# Patient Record
Sex: Female | Born: 1940 | Race: White | Hispanic: No | Marital: Single | Smoking: Never smoker
Health system: Southern US, Community
[De-identification: ages and names within clinical notes are randomized; demographics above are authoritative.]

## PROBLEM LIST (undated history)

## (undated) DIAGNOSIS — F410 Panic disorder [episodic paroxysmal anxiety] without agoraphobia: Secondary | ICD-10-CM

## (undated) DIAGNOSIS — I499 Cardiac arrhythmia, unspecified: Secondary | ICD-10-CM

## (undated) DIAGNOSIS — F419 Anxiety disorder, unspecified: Secondary | ICD-10-CM

## (undated) HISTORY — PX: BREAST BIOPSY: SHX20

## (undated) HISTORY — PX: BREAST EXCISIONAL BIOPSY: SUR124

---

## 1997-11-15 ENCOUNTER — Ambulatory Visit (HOSPITAL_COMMUNITY): Admission: RE | Admit: 1997-11-15 | Discharge: 1997-11-15 | Payer: Self-pay | Admitting: *Deleted

## 1998-11-21 ENCOUNTER — Ambulatory Visit (HOSPITAL_COMMUNITY): Admission: RE | Admit: 1998-11-21 | Discharge: 1998-11-21 | Payer: Self-pay | Admitting: *Deleted

## 1999-11-06 ENCOUNTER — Ambulatory Visit (HOSPITAL_COMMUNITY): Admission: RE | Admit: 1999-11-06 | Discharge: 1999-11-06 | Payer: Self-pay

## 1999-11-12 ENCOUNTER — Encounter: Admission: RE | Admit: 1999-11-12 | Discharge: 1999-11-12 | Payer: Self-pay

## 2000-11-08 ENCOUNTER — Ambulatory Visit (HOSPITAL_COMMUNITY): Admission: RE | Admit: 2000-11-08 | Discharge: 2000-11-08 | Payer: Self-pay | Admitting: Gastroenterology

## 2000-11-18 ENCOUNTER — Ambulatory Visit (HOSPITAL_COMMUNITY): Admission: RE | Admit: 2000-11-18 | Discharge: 2000-11-18 | Payer: Self-pay | Admitting: Obstetrics & Gynecology

## 2000-11-22 ENCOUNTER — Encounter: Payer: Self-pay | Admitting: Obstetrics & Gynecology

## 2000-11-22 ENCOUNTER — Encounter: Admission: RE | Admit: 2000-11-22 | Discharge: 2000-11-22 | Payer: Self-pay | Admitting: Obstetrics & Gynecology

## 2001-11-24 ENCOUNTER — Ambulatory Visit (HOSPITAL_COMMUNITY): Admission: RE | Admit: 2001-11-24 | Discharge: 2001-11-24 | Payer: Self-pay | Admitting: Obstetrics & Gynecology

## 2001-11-27 ENCOUNTER — Encounter: Payer: Self-pay | Admitting: Internal Medicine

## 2001-11-27 ENCOUNTER — Encounter: Admission: RE | Admit: 2001-11-27 | Discharge: 2001-11-27 | Payer: Self-pay | Admitting: Internal Medicine

## 2002-11-23 ENCOUNTER — Ambulatory Visit (HOSPITAL_COMMUNITY): Admission: RE | Admit: 2002-11-23 | Discharge: 2002-11-23 | Payer: Self-pay | Admitting: *Deleted

## 2002-12-14 ENCOUNTER — Encounter: Payer: Self-pay | Admitting: Internal Medicine

## 2002-12-14 ENCOUNTER — Encounter: Admission: RE | Admit: 2002-12-14 | Discharge: 2002-12-14 | Payer: Self-pay | Admitting: Internal Medicine

## 2003-12-16 ENCOUNTER — Encounter: Admission: RE | Admit: 2003-12-16 | Discharge: 2003-12-16 | Payer: Self-pay | Admitting: Internal Medicine

## 2005-02-08 ENCOUNTER — Encounter: Admission: RE | Admit: 2005-02-08 | Discharge: 2005-02-08 | Payer: Self-pay | Admitting: Internal Medicine

## 2005-03-03 ENCOUNTER — Encounter: Admission: RE | Admit: 2005-03-03 | Discharge: 2005-03-03 | Payer: Self-pay | Admitting: Internal Medicine

## 2005-03-05 ENCOUNTER — Encounter: Admission: RE | Admit: 2005-03-05 | Discharge: 2005-03-05 | Payer: Self-pay | Admitting: Internal Medicine

## 2005-03-05 ENCOUNTER — Encounter (INDEPENDENT_AMBULATORY_CARE_PROVIDER_SITE_OTHER): Payer: Self-pay | Admitting: Specialist

## 2005-10-07 ENCOUNTER — Encounter: Admission: RE | Admit: 2005-10-07 | Discharge: 2005-10-07 | Payer: Self-pay | Admitting: Internal Medicine

## 2006-02-09 ENCOUNTER — Encounter: Admission: RE | Admit: 2006-02-09 | Discharge: 2006-02-09 | Payer: Self-pay | Admitting: Surgery

## 2007-02-13 ENCOUNTER — Encounter: Admission: RE | Admit: 2007-02-13 | Discharge: 2007-02-13 | Payer: Self-pay

## 2008-02-14 ENCOUNTER — Encounter: Admission: RE | Admit: 2008-02-14 | Discharge: 2008-02-14 | Payer: Self-pay | Admitting: Surgery

## 2009-02-14 ENCOUNTER — Encounter: Admission: RE | Admit: 2009-02-14 | Discharge: 2009-02-14 | Payer: Self-pay | Admitting: Unknown Physician Specialty

## 2010-02-16 ENCOUNTER — Encounter
Admission: RE | Admit: 2010-02-16 | Discharge: 2010-02-16 | Payer: Self-pay | Source: Home / Self Care | Attending: Unknown Physician Specialty | Admitting: Unknown Physician Specialty

## 2010-03-28 ENCOUNTER — Encounter: Payer: Self-pay | Admitting: Internal Medicine

## 2011-01-13 ENCOUNTER — Other Ambulatory Visit: Payer: Self-pay | Admitting: Internal Medicine

## 2011-01-13 DIAGNOSIS — Z1231 Encounter for screening mammogram for malignant neoplasm of breast: Secondary | ICD-10-CM

## 2011-02-19 ENCOUNTER — Ambulatory Visit
Admission: RE | Admit: 2011-02-19 | Discharge: 2011-02-19 | Disposition: A | Discharge status: Home / Self Care | Payer: Medicare Other | Source: Ambulatory Visit | Attending: Internal Medicine | Admitting: Internal Medicine

## 2011-02-19 DIAGNOSIS — Z1231 Encounter for screening mammogram for malignant neoplasm of breast: Secondary | ICD-10-CM

## 2012-03-13 ENCOUNTER — Other Ambulatory Visit (HOSPITAL_BASED_OUTPATIENT_CLINIC_OR_DEPARTMENT_OTHER): Payer: Self-pay | Admitting: Internal Medicine

## 2012-03-13 DIAGNOSIS — Z1231 Encounter for screening mammogram for malignant neoplasm of breast: Secondary | ICD-10-CM

## 2012-03-30 ENCOUNTER — Ambulatory Visit (HOSPITAL_BASED_OUTPATIENT_CLINIC_OR_DEPARTMENT_OTHER)
Admission: RE | Admit: 2012-03-30 | Discharge: 2012-03-30 | Disposition: A | Discharge status: Home / Self Care | Payer: Medicare Other | Source: Ambulatory Visit | Attending: Internal Medicine | Admitting: Internal Medicine

## 2012-03-30 DIAGNOSIS — Z1231 Encounter for screening mammogram for malignant neoplasm of breast: Secondary | ICD-10-CM | POA: Insufficient documentation

## 2013-03-06 ENCOUNTER — Other Ambulatory Visit (HOSPITAL_BASED_OUTPATIENT_CLINIC_OR_DEPARTMENT_OTHER): Payer: Self-pay | Admitting: Internal Medicine

## 2013-03-06 DIAGNOSIS — Z1231 Encounter for screening mammogram for malignant neoplasm of breast: Secondary | ICD-10-CM

## 2013-04-02 ENCOUNTER — Ambulatory Visit (HOSPITAL_BASED_OUTPATIENT_CLINIC_OR_DEPARTMENT_OTHER): Payer: Medicare Other

## 2013-04-06 ENCOUNTER — Ambulatory Visit (HOSPITAL_BASED_OUTPATIENT_CLINIC_OR_DEPARTMENT_OTHER): Payer: Medicare Other

## 2013-04-09 ENCOUNTER — Ambulatory Visit (HOSPITAL_BASED_OUTPATIENT_CLINIC_OR_DEPARTMENT_OTHER): Payer: Medicare Other

## 2013-04-16 ENCOUNTER — Ambulatory Visit (HOSPITAL_BASED_OUTPATIENT_CLINIC_OR_DEPARTMENT_OTHER)
Admission: RE | Admit: 2013-04-16 | Discharge: 2013-04-16 | Disposition: A | Discharge status: Home / Self Care | Payer: Medicare Other | Source: Ambulatory Visit | Attending: Internal Medicine | Admitting: Internal Medicine

## 2013-04-16 DIAGNOSIS — Z1231 Encounter for screening mammogram for malignant neoplasm of breast: Secondary | ICD-10-CM | POA: Insufficient documentation

## 2014-03-18 ENCOUNTER — Other Ambulatory Visit (HOSPITAL_BASED_OUTPATIENT_CLINIC_OR_DEPARTMENT_OTHER): Payer: Self-pay | Admitting: Internal Medicine

## 2014-03-18 DIAGNOSIS — Z1231 Encounter for screening mammogram for malignant neoplasm of breast: Secondary | ICD-10-CM

## 2014-04-22 ENCOUNTER — Ambulatory Visit (HOSPITAL_BASED_OUTPATIENT_CLINIC_OR_DEPARTMENT_OTHER): Payer: Self-pay

## 2014-04-23 ENCOUNTER — Ambulatory Visit (HOSPITAL_BASED_OUTPATIENT_CLINIC_OR_DEPARTMENT_OTHER)
Admission: RE | Admit: 2014-04-23 | Discharge: 2014-04-23 | Disposition: A | Discharge status: Home / Self Care | Payer: Medicare HMO | Source: Ambulatory Visit | Attending: Internal Medicine | Admitting: Internal Medicine

## 2014-04-23 DIAGNOSIS — Z1231 Encounter for screening mammogram for malignant neoplasm of breast: Secondary | ICD-10-CM | POA: Diagnosis not present

## 2015-03-31 ENCOUNTER — Other Ambulatory Visit (HOSPITAL_BASED_OUTPATIENT_CLINIC_OR_DEPARTMENT_OTHER): Payer: Self-pay | Admitting: Internal Medicine

## 2015-03-31 DIAGNOSIS — Z1231 Encounter for screening mammogram for malignant neoplasm of breast: Secondary | ICD-10-CM

## 2015-04-28 ENCOUNTER — Ambulatory Visit (HOSPITAL_BASED_OUTPATIENT_CLINIC_OR_DEPARTMENT_OTHER)
Admission: RE | Admit: 2015-04-28 | Discharge: 2015-04-28 | Disposition: A | Discharge status: Home / Self Care | Payer: Medicare HMO | Source: Ambulatory Visit | Attending: Internal Medicine | Admitting: Internal Medicine

## 2015-04-28 DIAGNOSIS — Z1231 Encounter for screening mammogram for malignant neoplasm of breast: Secondary | ICD-10-CM | POA: Insufficient documentation

## 2016-04-13 ENCOUNTER — Other Ambulatory Visit (HOSPITAL_BASED_OUTPATIENT_CLINIC_OR_DEPARTMENT_OTHER): Payer: Self-pay | Admitting: Internal Medicine

## 2016-04-13 DIAGNOSIS — Z1231 Encounter for screening mammogram for malignant neoplasm of breast: Secondary | ICD-10-CM

## 2016-04-29 ENCOUNTER — Encounter (HOSPITAL_BASED_OUTPATIENT_CLINIC_OR_DEPARTMENT_OTHER): Payer: Self-pay

## 2016-04-29 ENCOUNTER — Ambulatory Visit (HOSPITAL_BASED_OUTPATIENT_CLINIC_OR_DEPARTMENT_OTHER)
Admission: RE | Admit: 2016-04-29 | Discharge: 2016-04-29 | Disposition: A | Discharge status: Home / Self Care | Payer: Medicare HMO | Source: Ambulatory Visit | Attending: Internal Medicine | Admitting: Internal Medicine

## 2016-04-29 DIAGNOSIS — Z1231 Encounter for screening mammogram for malignant neoplasm of breast: Secondary | ICD-10-CM | POA: Diagnosis not present

## 2016-08-11 ENCOUNTER — Other Ambulatory Visit: Payer: Self-pay

## 2016-08-11 ENCOUNTER — Emergency Department (HOSPITAL_COMMUNITY): Payer: Medicare HMO

## 2016-08-11 ENCOUNTER — Emergency Department (HOSPITAL_COMMUNITY)
Admission: EM | Admit: 2016-08-11 | Discharge: 2016-08-11 | Disposition: A | Discharge status: Home / Self Care | Payer: Medicare HMO | Attending: Emergency Medicine | Admitting: Emergency Medicine

## 2016-08-11 ENCOUNTER — Encounter (HOSPITAL_COMMUNITY): Payer: Self-pay | Admitting: Neurology

## 2016-08-11 DIAGNOSIS — I499 Cardiac arrhythmia, unspecified: Secondary | ICD-10-CM | POA: Insufficient documentation

## 2016-08-11 DIAGNOSIS — R002 Palpitations: Secondary | ICD-10-CM | POA: Diagnosis present

## 2016-08-11 HISTORY — DX: Cardiac arrhythmia, unspecified: I49.9

## 2016-08-11 HISTORY — DX: Anxiety disorder, unspecified: F41.9

## 2016-08-11 HISTORY — DX: Panic disorder (episodic paroxysmal anxiety): F41.0

## 2016-08-11 LAB — CBC
HCT: 40.4 % (ref 36.0–46.0)
Hemoglobin: 12.5 g/dL (ref 12.0–15.0)
MCH: 24.3 pg — AB (ref 26.0–34.0)
MCHC: 30.9 g/dL (ref 30.0–36.0)
MCV: 78.6 fL (ref 78.0–100.0)
PLATELETS: 249 10*3/uL (ref 150–400)
RBC: 5.14 MIL/uL — AB (ref 3.87–5.11)
RDW: 15.3 % (ref 11.5–15.5)
WBC: 11.1 10*3/uL — AB (ref 4.0–10.5)

## 2016-08-11 LAB — BASIC METABOLIC PANEL
Anion gap: 9 (ref 5–15)
BUN: 16 mg/dL (ref 6–20)
CALCIUM: 9.7 mg/dL (ref 8.9–10.3)
CO2: 22 mmol/L (ref 22–32)
CREATININE: 0.92 mg/dL (ref 0.44–1.00)
Chloride: 106 mmol/L (ref 101–111)
GFR calc non Af Amer: 59 mL/min — ABNORMAL LOW (ref >60)
Glucose, Bld: 208 mg/dL — ABNORMAL HIGH (ref 65–99)
Potassium: 3.7 mmol/L (ref 3.5–5.1)
SODIUM: 137 mmol/L (ref 135–145)

## 2016-08-11 LAB — I-STAT TROPONIN, ED: TROPONIN I, POC: 0.01 ng/mL (ref 0.00–0.08)

## 2016-08-11 MED ORDER — SODIUM CHLORIDE 0.9 % IV BOLUS (SEPSIS)
500.0000 mL | Freq: Once | INTRAVENOUS | Status: AC
  Administered 2016-08-11: 500 mL via INTRAVENOUS

## 2016-08-11 MED ORDER — LORAZEPAM 2 MG/ML IJ SOLN
1.0000 mg | Freq: Once | INTRAMUSCULAR | Status: AC
  Administered 2016-08-11: 1 mg via INTRAVENOUS
  Filled 2016-08-11: qty 1

## 2016-08-11 MED ORDER — METOPROLOL TARTRATE 25 MG PO TABS: 25.0000 mg | ORAL_TABLET | Freq: Two times a day (BID) | ORAL | 0 refills | Status: AC | PRN

## 2016-08-11 MED ORDER — METOPROLOL TARTRATE 5 MG/5ML IV SOLN
2.5000 mg | Freq: Once | INTRAVENOUS | Status: AC
  Administered 2016-08-11: 2.5 mg via INTRAVENOUS
  Filled 2016-08-11: qty 5

## 2016-08-11 NOTE — ED Provider Notes (Addendum)
Medical screening examination/treatment/procedure(s) were conducted as a shared visit with non-physician practitioner(s) and myself.  I personally evaluated the patient during the encounter.   EKG Interpretation None     Patient has had problems with periodic racing heart and irregular heartbeats. She reports this is happening more often and sometimes at night keeps her awake. She reports she has been seen by [facility] cardiology. She is taking metoprolol but it doesn't seem to be helping. She reports her doctor told her not to take any more Ativan for the symptoms did seem to help. She reports she was taking once a day and last dose was 3 days ago. She does not have chest pain dyspnea or lower extremity swelling.  On exam patient is alert and appropriate. She does not have respiratory distress at rest. Heart is tachycardic and irregular. No peripheral edema. Skin condition very good. Neurologic is alert appropriate and coordinated.  EKG shows tachycardia with intermittent PACs and PVCs. Plan is for small fluid bolus, Ativan and a low dose of Lopressor. Pending patient's response and troponin results, will make final disposition. This time there does not appear to be acute ischemic symptomology.  I agree with plan and management.   Arby BarrettePfeiffer, Bellany Elbaum, MD 08/11/16 1501    Arby BarrettePfeiffer, Jaaliyah Lucatero, MD 08/16/16 [phone]

## 2016-08-11 NOTE — ED Notes (Signed)
Pt endorses understanding of d/c instructions and has no further questions. Pt stable and NAD. Pt's pastor/church member called for ride home. VSS.

## 2016-08-11 NOTE — ED Triage Notes (Addendum)
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 Today she was sitting and felt like her heart started racing. EMS arrived reports HR is erratic from 90-150's. Difficult to discern if afib or ST. Pt denies any pain. Is a x 4. Feels nervous. 150/94, 96% RA, 20RR. She reports history of irregular heart beat and is on her metoprolol, which she took this morning. Pt appears very nervous.

## 2016-08-11 NOTE — ED Provider Notes (Signed)
[facility]-EMERGENCY DEPT Provider Note   CSN: [id number] Arrival date & time: 08/11/16  1337     History   Chief Complaint Chief Complaint  Patient presents with  . Tachycardia    HPI Terri Cunningham is a 76 y.o. female.  HPI  76 y.o. female with a hx of Anxiety, Irregular Heart Beat, Panic Attacks, presents to the Emergency Department today complaining of palpitations PTA. Pt states that she was at an auto auction working "on papers" at rest when she started to have palpitations. Notes hx same. Denies active CP/SOB. No N/V. No diaphoresis. EMS called and noted HR between 90s-150s. Pt appears anxious. Pt seen by [facility] with workup last year in October that was unremarkable (echocardiograms, Hotler monitor). Noted infrequent PVCs. Started on Metoprolol 50mg  daily. Seen by PCP on 08-09-16. Pt likely anxiety related. Has been taking Prozac for this with Ativan PRN. Has referral to psychiatry. No fevers. No URI symptoms. No other symptoms noted.   Chart Review with PCP: Onset of symptoms was approximately 5 years ago. She notes the onset was around when she retired. She states that she works one day a week now. She states her financial issues worsens her anxiety. Symptoms have been gradual worsening since she retired  Past Medical History:  Diagnosis Date  . Anxiety   . Irregular heart beat   . Panic attack     There are no active problems to display for this patient.   Past Surgical History:  Procedure Laterality Date  . BREAST BIOPSY    . BREAST EXCISIONAL BIOPSY      OB History    No data available       Home Medications    Prior to Admission medications   Not on File    Family History No family history on file.  Social History Social History  Substance Use Topics  . Smoking status: Never Smoker  . Smokeless tobacco: Not on file  . Alcohol use No     Allergies   Aspirin; Codeine; and Sulfa antibiotics   Review of Systems Review of  Systems ROS reviewed and all are negative for acute change except as noted in the HPI.  Physical Exam Updated Vital Signs BP (!) 140/56 (BP Location: Right Arm)   Pulse (!) 116   Temp 99.2 F (37.3 C) (Oral)   Resp 20   Ht 5\' 9"  (1.753 m)   Wt 57.6 kg (127 lb)   SpO2 99%   BMI 18.75 kg/m   Physical Exam  Constitutional: She is oriented to person, place, and time. Vital signs are normal. She appears well-developed and well-nourished. No distress.  HENT:  Head: Normocephalic and atraumatic.  Right Ear: Hearing, tympanic membrane, external ear and ear canal normal.  Left Ear: Hearing, tympanic membrane, external ear and ear canal normal.  Nose: Nose normal.  Mouth/Throat: Uvula is midline, oropharynx is clear and moist and mucous membranes are normal. No trismus in the jaw. No oropharyngeal exudate, posterior oropharyngeal erythema or tonsillar abscesses.  Eyes: Conjunctivae and EOM are normal. Pupils are equal, round, and reactive to light.  Neck: Normal range of motion. Neck supple. No tracheal deviation present.  Cardiovascular: Regular rhythm, S1 normal, S2 normal, normal heart sounds, intact distal pulses and normal pulses.  Tachycardia present.   Pulmonary/Chest: Effort normal and breath sounds normal. No respiratory distress. She has no decreased breath sounds. She has no wheezes. She has no rhonchi. She has no rales.  Abdominal:  Normal appearance and bowel sounds are normal. There is no tenderness.  Musculoskeletal: Normal range of motion.  Neurological: She is alert and oriented to person, place, and time.  Skin: Skin is warm and dry.  Psychiatric: She has a normal mood and affect. Her speech is normal and behavior is normal. Thought content normal.  Nursing note and vitals reviewed.  ED Treatments / Results  Labs (all labs ordered are listed, but only abnormal results are displayed) Labs Reviewed  BASIC METABOLIC PANEL - Abnormal; Notable for the following:       Result  Value   Glucose, Bld 208 (*)    GFR calc non Af Amer 59 (*)    All other components within normal limits  CBC - Abnormal; Notable for the following:    WBC 11.1 (*)    RBC 5.14 (*)    MCH 24.3 (*)    All other components within normal limits  I-STAT TROPOININ, ED   EKG  EKG Interpretation None      Radiology Dg Chest 2 View  Result Date: 08/11/2016 CLINICAL DATA:  Heart racing. EXAM: CHEST  2 VIEW COMPARISON:  07/16/2016.  02/06/2012. FINDINGS: Cardiomegaly with normal pulmonary vascularity. Prominent stable large hiatal hernia. No focal infiltrate. No pleural effusion or pneumothorax. Stable tiny nodule right upper lobe, most likely tiny granuloma, similar finding noted on 02/06/2012. IMPRESSION: 1.  Mild cardiomegaly.  No pulmonary venous congestion. 2. No acute cardiopulmonary disease. Stable pulmonary nodule right upper lobe consistent small granuloma. 3. Stable prominent hiatal hernia . Electronically Signed   By: Maisie Fus  Register   On: 08/11/2016 14:48    Procedures Procedures (including critical care time)  Medications Ordered in ED Medications - No data to display   Initial Impression / Assessment and Plan / ED Course  I have reviewed the triage vital signs and the nursing notes.  Pertinent labs & imaging results that were available during my care of the patient were reviewed by me and considered in my medical decision making (see chart for details).  Final Clinical Impressions(s) / ED Diagnoses  {I have reviewed and evaluated the relevant laboratory values. {I have reviewed and evaluated the relevant imaging studies. {I have interpreted the relevant EKG. {I have reviewed the relevant previous healthcare records.  {I obtained HPI from historian. {Patient discussed with supervising physician.  ED Course:  Assessment: Pt is a 76 y.o. female with hx Anxiety, Irregular Heart Beat, Panic Attacks, presents to the Emergency Department today complaining of palpitations PTA. Pt  states that she was at an auto auction working "on papers" at rest when she started to have palpitations. Notes hx same. Denies active CP/SOB. No N/V. No diaphoresis. EMS called and noted HR between 90s-150s. Pt appears anxious. Pt seen by [facility] with workup last year in October that was unremarkable (echocardiograms, Hotler monitor). Noted infrequent PVCs. Started on Metoprolol 50mg  daily. Seen by PCP on 08-09-16. Pt likely anxiety related. Has been taking Prozac for this with Ativan PRN. Has referral to psychiatry.. On exam, pt in NAD. Nontoxic/nonseptic appearing. VS with tachycardia. Afebrile. Lungs CTA. Heart RRR. Abdomen nontender soft. EKG SR tachycardia. Infrequent PVCs. CBC unremarkable. BMP unremarkable. Trop negative. CXR unremarkable. Given NS Bolus and Ativan in ED. Seen by Supervising Physician. HR improved with ativan to 90-100 bpm. Given 2.5 Labetolol. Repeat EKG sinus rhythm with multiple PVCs and PACs. Similar to previous Cardiology visits as well as outpatient PCP office. Discussed with attending physician. Will Rx IR  Metoprolol 25mg  to use if symptoms arise with a max of 25mg  in one day for an equivalent of an additional 50mg . Encouraged patient to call Cardiologist for follow up for further evaluation. Strict return precautions given. Plan is to DC Home with follow up to PCP. At time of discharge, Patient is in no acute distress. Vital Signs are stable. Patient is able to ambulate. Patient able to tolerate PO.   Disposition/Plan:  DC Home Additional Verbal discharge instructions given and discussed with patient.  Pt Instructed to f/u with PCP in the next week for evaluation and treatment of symptoms. Return precautions given Pt acknowledges and agrees with plan  Supervising Physician Arby Barrette, MD  Final diagnoses:  Palpitations  Irregular heart beat    New Prescriptions New Prescriptions   No medications on file     Audry Pili, Cordelia Poche 08/11/16  1536    Arby Barrette, MD 08/16/16 [phone]

## 2016-08-11 NOTE — ED Notes (Signed)
Patient transported to X-ray 

## 2016-08-11 NOTE — Discharge Instructions (Signed)
Please read and follow all provided instructions.  Your diagnoses today include:  1. Palpitations   2. Irregular heart beat     Tests performed today include: Vital signs. See below for your results today.   Medications prescribed:  Take as prescribed. Please take your regular extended release metoprolol of 50mg  once a day. When you begin to have symptoms of palpitations, please take the additional 25mg  Immediate release Metoprolol ONCE. You can take a maximum of 50mg  in ONE DAY if you have more symptoms in one day. DO NOT EXCEED THIS NUMBER of 50mg  of the IMMEDIATE RELEASE.   Home care instructions:  Follow any educational materials contained in this packet.  Follow-up instructions: Please follow-up with your Cardiologist for further evaluation of symptoms and treatment   Return instructions:  Please return to the Emergency Department if you do not get better, if you get worse, or new symptoms OR  - Fever (temperature greater than 101.38F)  - Bleeding that does not stop with holding pressure to the area    -Severe pain (please note that you may be more sore the day after your accident)  - Chest Pain  - Difficulty breathing  - Severe nausea or vomiting  - Inability to tolerate food and liquids  - Passing out  - Skin becoming red around your wounds  - Change in mental status (confusion or lethargy)  - New numbness or weakness    Please return if you have any other emergent concerns.  Additional Information:  Your vital signs today were: BP 119/82    Pulse 68    Temp 99.2 F (37.3 C) (Oral)    Resp 14    Ht 5\' 9"  (1.753 m)    Wt 57.6 kg (127 lb)    SpO2 100%    BMI 18.75 kg/m  If your blood pressure (BP) was elevated above 135/85 this visit, please have this repeated by your doctor within one month. ---------------

## 2017-03-28 ENCOUNTER — Other Ambulatory Visit (HOSPITAL_BASED_OUTPATIENT_CLINIC_OR_DEPARTMENT_OTHER): Payer: Self-pay | Admitting: Internal Medicine

## 2017-03-28 DIAGNOSIS — Z1231 Encounter for screening mammogram for malignant neoplasm of breast: Secondary | ICD-10-CM

## 2017-04-30 ENCOUNTER — Ambulatory Visit (HOSPITAL_BASED_OUTPATIENT_CLINIC_OR_DEPARTMENT_OTHER)
Admission: RE | Admit: 2017-04-30 | Discharge: 2017-04-30 | Disposition: A | Discharge status: Home / Self Care | Payer: Medicare HMO | Source: Ambulatory Visit | Attending: Internal Medicine | Admitting: Internal Medicine

## 2017-04-30 DIAGNOSIS — Z1231 Encounter for screening mammogram for malignant neoplasm of breast: Secondary | ICD-10-CM | POA: Insufficient documentation

## 2018-02-22 ENCOUNTER — Emergency Department (HOSPITAL_COMMUNITY)
Admission: EM | Admit: 2018-02-22 | Discharge: 2018-02-22 | Disposition: A | Discharge status: Home / Self Care | Payer: Medicare HMO | Attending: Emergency Medicine | Admitting: Emergency Medicine

## 2018-02-22 ENCOUNTER — Encounter (HOSPITAL_COMMUNITY): Payer: Self-pay | Admitting: Emergency Medicine

## 2018-02-22 ENCOUNTER — Other Ambulatory Visit: Payer: Self-pay

## 2018-02-22 DIAGNOSIS — K529 Noninfective gastroenteritis and colitis, unspecified: Secondary | ICD-10-CM | POA: Insufficient documentation

## 2018-02-22 DIAGNOSIS — Z79899 Other long term (current) drug therapy: Secondary | ICD-10-CM | POA: Insufficient documentation

## 2018-02-22 DIAGNOSIS — E860 Dehydration: Secondary | ICD-10-CM | POA: Diagnosis not present

## 2018-02-22 DIAGNOSIS — R112 Nausea with vomiting, unspecified: Secondary | ICD-10-CM | POA: Diagnosis present

## 2018-02-22 LAB — CBC
HCT: 42.2 % (ref 36.0–46.0)
Hemoglobin: 13.2 g/dL (ref 12.0–15.0)
MCH: 26.3 pg (ref 26.0–34.0)
MCHC: 31.3 g/dL (ref 30.0–36.0)
MCV: 84.1 fL (ref 80.0–100.0)
Platelets: 243 10*3/uL (ref 150–400)
RBC: 5.02 MIL/uL (ref 3.87–5.11)
RDW: 13.4 % (ref 11.5–15.5)
WBC: 15.5 10*3/uL — ABNORMAL HIGH (ref 4.0–10.5)
nRBC: 0 % (ref 0.0–0.2)

## 2018-02-22 LAB — COMPREHENSIVE METABOLIC PANEL
ALBUMIN: 3.7 g/dL (ref 3.5–5.0)
ALK PHOS: 62 U/L (ref 38–126)
ALT: 14 U/L (ref 0–44)
AST: 24 U/L (ref 15–41)
Anion gap: 10 (ref 5–15)
BILIRUBIN TOTAL: 0.9 mg/dL (ref 0.3–1.2)
BUN: 20 mg/dL (ref 8–23)
CALCIUM: 8.9 mg/dL (ref 8.9–10.3)
CO2: 23 mmol/L (ref 22–32)
Chloride: 109 mmol/L (ref 98–111)
Creatinine, Ser: 1.07 mg/dL — ABNORMAL HIGH (ref 0.44–1.00)
GFR calc Af Amer: 57 mL/min — ABNORMAL LOW (ref >60)
GFR calc non Af Amer: 49 mL/min — ABNORMAL LOW (ref >60)
GLUCOSE: 172 mg/dL — AB (ref 70–99)
POTASSIUM: 4 mmol/L (ref 3.5–5.1)
Sodium: 142 mmol/L (ref 135–145)
TOTAL PROTEIN: 6.2 g/dL — AB (ref 6.5–8.1)

## 2018-02-22 LAB — URINALYSIS, ROUTINE W REFLEX MICROSCOPIC
BILIRUBIN URINE: NEGATIVE
GLUCOSE, UA: 50 mg/dL — AB
HGB URINE DIPSTICK: NEGATIVE
Ketones, ur: 5 mg/dL — AB
Leukocytes, UA: NEGATIVE
Nitrite: NEGATIVE
Protein, ur: NEGATIVE mg/dL
SPECIFIC GRAVITY, URINE: 1.025 (ref 1.005–1.030)
pH: 5 (ref 5.0–8.0)

## 2018-02-22 LAB — LIPASE, BLOOD: Lipase: 59 U/L — ABNORMAL HIGH (ref 11–51)

## 2018-02-22 LAB — I-STAT CG4 LACTIC ACID, ED: LACTIC ACID, VENOUS: 1.29 mmol/L (ref 0.5–1.9)

## 2018-02-22 MED ORDER — ONDANSETRON 4 MG PO TBDP: 4.0000 mg | ORAL_TABLET | Freq: Three times a day (TID) | ORAL | 0 refills | Status: AC | PRN

## 2018-02-22 MED ORDER — LORAZEPAM 1 MG PO TABS
0.5000 mg | ORAL_TABLET | Freq: Once | ORAL | Status: AC
  Administered 2018-02-22: 0.5 mg via ORAL
  Filled 2018-02-22: qty 1

## 2018-02-22 MED ORDER — SODIUM CHLORIDE 0.9 % IV BOLUS
1000.0000 mL | Freq: Once | INTRAVENOUS | Status: AC
  Administered 2018-02-22: 1000 mL via INTRAVENOUS

## 2018-02-22 MED ORDER — ONDANSETRON HCL 4 MG/2ML IJ SOLN
4.0000 mg | Freq: Once | INTRAMUSCULAR | Status: AC
  Administered 2018-02-22: 4 mg via INTRAVENOUS
  Filled 2018-02-22: qty 2

## 2018-02-22 NOTE — ED Notes (Signed)
Pt tolerating PO food and fluids

## 2018-02-22 NOTE — Discharge Instructions (Signed)
You were seen today for nausea, vomiting, and diarrhea.  This is likely a viral process.  It is very important that you stay hydrated.  Take Zofran as needed for nausea and vomiting.  Follow-up closely with your primary doctor.  If you develop pain, inability to tolerate fluids or any new or worsening symptoms you should be reevaluated.

## 2018-02-22 NOTE — ED Triage Notes (Signed)
BIB [facility] from home with c/o of 4 episodes of emesis and 1 episode of diarrhea after eating dinner tonight. Per EMS pt found in Afib RVR on arrival with known hx. Received 4mg  Zofran and 500mL NS in route.

## 2018-02-22 NOTE — ED Provider Notes (Signed)
[facility] Provider Note   CSN: [id number] Arrival date & time: 02/22/18  0038     History   Chief Complaint Chief Complaint  Patient presents with  . Emesis  . Atrial Fibrillation    HPI Terri Cunningham is a 77 y.o. female.  HPI  This is a 77 year old female with a history of irregular heartbeat who presents with nausea and vomiting.  Patient reports onset of symptoms after dinner tonight at approximately 6 PM.  She reports nausea, vomiting, diarrhea.  No known sick contacts.  She denies any chest pain, shortness of breath, fevers.  She does report chills.  She reports diffuse crampy abdominal discomfort.  She rates her pain at 4 out of 10.  She states that she feels very dehydrated.  She denies any history of atrial fibrillation but was noted to have an irregular heartbeat upon EMS arrival.  She is not on any anticoagulants.  Past Medical History:  Diagnosis Date  . Anxiety   . Irregular heart beat   . Panic attack     There are no active problems to display for this patient.   Past Surgical History:  Procedure Laterality Date  . BREAST BIOPSY    . BREAST EXCISIONAL BIOPSY       OB History   No obstetric history on file.      Home Medications    Prior to Admission medications   Medication Sig Start Date End Date Taking? Authorizing Provider  FLUoxetine (PROZAC) 40 MG capsule Take 40 mg by mouth daily. 08/18/16  Yes [provider]  levothyroxine (SYNTHROID, LEVOTHROID) 25 MCG tablet Take 25 mcg by mouth daily. 03/03/16  Yes [provider]  LORazepam (ATIVAN) 0.5 MG tablet Take 0.5 mg by mouth 2 (two) times daily as needed for anxiety.  08/03/16  Yes [provider]  metoprolol tartrate (LOPRESSOR) 25 MG tablet Take 1 tablet (25 mg total) by mouth 2 (two) times daily as needed. DO NOT EXCEED 50mg  of this medication in one day. Patient not taking: Reported on 02/22/2018 08/11/16   Audry PiliMohr, Tyler, PA-C    ondansetron (ZOFRAN ODT) 4 MG disintegrating tablet Take 1 tablet (4 mg total) by mouth every 8 (eight) hours as needed for nausea or vomiting. 02/22/18   , Mayer Maskerourtney F, MD    Family History History reviewed. No pertinent family history.  Social History Social History   Tobacco Use  . Smoking status: Never Smoker  Substance Use Topics  . Alcohol use: No  . Drug use: Not on file     Allergies   Aspirin; Codeine; and Sulfa antibiotics   Review of Systems Review of Systems  Constitutional: Positive for chills. Negative for fever.  Respiratory: Negative for shortness of breath.   Cardiovascular: Negative for chest pain, palpitations and leg swelling.  Gastrointestinal: Positive for abdominal pain, diarrhea, nausea and vomiting.  Genitourinary: Negative for dysuria.  All other systems reviewed and are negative.    Physical Exam Updated Vital Signs BP 128/81   Pulse (!) 109   Temp (!) 100.9 F (38.3 C) (Rectal)   Resp 19   Ht 1.753 m (5\' 9" )   Wt 54.4 kg   SpO2 98%   BMI 17.72 kg/m   Physical Exam Vitals signs and nursing note reviewed.  Constitutional:      Appearance: She is well-developed. She is ill-appearing. She is not toxic-appearing.  HENT:     Head: Normocephalic and atraumatic.  Mouth/Throat:     Mouth: Mucous membranes are dry.  Eyes:     Pupils: Pupils are equal, round, and reactive to light.  Neck:     Musculoskeletal: Neck supple.  Cardiovascular:     Rate and Rhythm: Tachycardia present. Rhythm irregular.     Pulses: Normal pulses.     Heart sounds: Normal heart sounds.  Pulmonary:     Effort: Pulmonary effort is normal. No respiratory distress.     Breath sounds: No wheezing.  Abdominal:     General: Bowel sounds are normal.     Palpations: Abdomen is soft.     Tenderness: There is no abdominal tenderness. There is no guarding or rebound.  Musculoskeletal:        General: No swelling.  Skin:    General: Skin is warm and dry.   Neurological:     Mental Status: She is alert and oriented to person, place, and time.  Psychiatric:        Mood and Affect: Mood normal.      ED Treatments / Results  Labs (all labs ordered are listed, but only abnormal results are displayed) Labs Reviewed  LIPASE, BLOOD - Abnormal; Notable for the following components:      Result Value   Lipase 59 (*)    All other components within normal limits  COMPREHENSIVE METABOLIC PANEL - Abnormal; Notable for the following components:   Glucose, Bld 172 (*)    Creatinine, Ser 1.07 (*)    Total Protein 6.2 (*)    GFR calc non Af Amer 49 (*)    GFR calc Af Amer 57 (*)    All other components within normal limits  CBC - Abnormal; Notable for the following components:   WBC 15.5 (*)    All other components within normal limits  URINALYSIS, ROUTINE W REFLEX MICROSCOPIC - Abnormal; Notable for the following components:   Glucose, UA 50 (*)    Ketones, ur 5 (*)    All other components within normal limits  I-STAT CG4 LACTIC ACID, ED    EKG EKG Interpretation  Date/Time:  Wednesday February 22 2018 00:51:04 EST Ventricular Rate:  121 PR Interval:    QRS Duration: 92 QT Interval:  326 QTC Calculation: 417 R Axis:   -91 Text Interpretation:  Sinus tachycardia Atrial premature complexes Probable left atrial enlargement Left anterior fascicular block RSR' in V1 or V2, right VCD or RVH Nonspecific T abnormalities, lateral leads Confirmed by Ross Marcus ([id number]) on 02/22/2018 1:11:42 AM   Radiology No results found.  Procedures Procedures (including critical care time)  Medications Ordered in ED Medications  ondansetron (ZOFRAN) injection 4 mg (4 mg Intravenous Given 02/22/18 0130)  sodium chloride 0.9 % bolus 1,000 mL (0 mLs Intravenous Stopped 02/22/18 0231)  LORazepam (ATIVAN) tablet 0.5 mg (0.5 mg Oral Given 02/22/18 0352)  sodium chloride 0.9 % bolus 1,000 mL (1,000 mLs Intravenous New Bag/Given 02/22/18 0353)      Initial Impression / Assessment and Plan / ED Course  I have reviewed the triage vital signs and the nursing notes.  Pertinent labs & imaging results that were available during my care of the patient were reviewed by me and considered in my medical decision making (see chart for details).     Patient presents with nausea, vomiting, diarrhea.  She is tachycardic and febrile to 100.9.  She is ill-appearing but nontoxic.  Her abdominal exam is benign.  She has good bowel sounds and she is nontender.  EKG shows sinus rhythm with multiple PACs.  No evidence of atrial fibrillation.  Patient was given fluids, nausea medication.  Lactate is normal.  White count is 15.5.  Creatinine 1.07.  Likely mild dehydration.  Otherwise no significant metabolic derangements.  Heart rate improved with fluids to 102.  Repeat EKG confirms sinus rhythm with frequent PACs.  Patient is able to tolerate fluids without difficulty.  States she feels somewhat better after getting some sleep.  Abdominal exam remains benign.  At this time suspect viral etiology.  Do not feel she needs imaging given her benign exam.  Discussed with patient's the importance of hydration and symptom control at home.  Will discharge with Zofran.  Follow-up closely with primary physician.  After history, exam, and medical workup I feel the patient has been appropriately medically screened and is safe for discharge home. Pertinent diagnoses were discussed with the patient. Patient was given return precautions.   Final Clinical Impressions(s) / ED Diagnoses   Final diagnoses:  Gastroenteritis  Dehydration    ED Discharge Orders         Ordered    ondansetron (ZOFRAN ODT) 4 MG disintegrating tablet  Every 8 hours PRN     02/22/18 0524           Shon Baton, MD 02/22/18 [phone]

## 2018-02-22 NOTE — ED Notes (Signed)
Pt given crackers and gingerale for PO challenge.

## 2018-02-22 NOTE — ED Notes (Signed)
Patient verbalizes understanding of discharge instructions. Opportunity for questioning and answers were provided. Armband removed by staff, pt discharged from ED in wheelchair.  

## 2018-02-22 NOTE — ED Notes (Signed)
Patients family here to pick up patient. Discharge instructions reviewed with patient by prior shift RN. Patient escorted out in wheelchair by staff.

## 2018-05-02 ENCOUNTER — Other Ambulatory Visit (HOSPITAL_BASED_OUTPATIENT_CLINIC_OR_DEPARTMENT_OTHER): Payer: Self-pay | Admitting: Internal Medicine

## 2018-05-02 DIAGNOSIS — Z1231 Encounter for screening mammogram for malignant neoplasm of breast: Secondary | ICD-10-CM

## 2018-05-05 ENCOUNTER — Ambulatory Visit (HOSPITAL_BASED_OUTPATIENT_CLINIC_OR_DEPARTMENT_OTHER)
Admission: RE | Admit: 2018-05-05 | Discharge: 2018-05-05 | Disposition: A | Discharge status: Home / Self Care | Payer: Medicare HMO | Source: Ambulatory Visit | Attending: Internal Medicine | Admitting: Internal Medicine

## 2018-05-05 DIAGNOSIS — Z1231 Encounter for screening mammogram for malignant neoplasm of breast: Secondary | ICD-10-CM | POA: Insufficient documentation

## 2018-05-09 ENCOUNTER — Other Ambulatory Visit: Payer: Self-pay | Admitting: Internal Medicine

## 2018-05-09 DIAGNOSIS — R928 Other abnormal and inconclusive findings on diagnostic imaging of breast: Secondary | ICD-10-CM

## 2018-05-16 ENCOUNTER — Ambulatory Visit
Admission: RE | Admit: 2018-05-16 | Discharge: 2018-05-16 | Disposition: A | Discharge status: Home / Self Care | Payer: Medicare HMO | Source: Ambulatory Visit | Attending: Internal Medicine | Admitting: Internal Medicine

## 2018-05-16 ENCOUNTER — Other Ambulatory Visit: Payer: Self-pay | Admitting: Internal Medicine

## 2018-05-16 DIAGNOSIS — R921 Mammographic calcification found on diagnostic imaging of breast: Secondary | ICD-10-CM

## 2018-05-16 DIAGNOSIS — R928 Other abnormal and inconclusive findings on diagnostic imaging of breast: Secondary | ICD-10-CM

## 2018-11-21 ENCOUNTER — Ambulatory Visit
Admission: RE | Admit: 2018-11-21 | Discharge: 2018-11-21 | Disposition: A | Discharge status: Home / Self Care | Payer: Medicare HMO | Source: Ambulatory Visit | Attending: Internal Medicine | Admitting: Internal Medicine

## 2018-11-21 ENCOUNTER — Other Ambulatory Visit: Payer: Self-pay | Admitting: Internal Medicine

## 2018-11-21 ENCOUNTER — Other Ambulatory Visit: Payer: Self-pay

## 2018-11-21 DIAGNOSIS — R921 Mammographic calcification found on diagnostic imaging of breast: Secondary | ICD-10-CM

## 2019-05-21 ENCOUNTER — Other Ambulatory Visit: Payer: Self-pay | Admitting: Obstetrics and Gynecology

## 2019-06-07 ENCOUNTER — Ambulatory Visit
Admission: RE | Admit: 2019-06-07 | Discharge: 2019-06-07 | Disposition: A | Discharge status: Home / Self Care | Payer: Medicare HMO | Source: Ambulatory Visit | Attending: Internal Medicine | Admitting: Internal Medicine

## 2019-06-07 ENCOUNTER — Other Ambulatory Visit: Payer: Self-pay | Admitting: Internal Medicine

## 2019-06-07 ENCOUNTER — Other Ambulatory Visit: Payer: Self-pay

## 2019-06-07 DIAGNOSIS — R921 Mammographic calcification found on diagnostic imaging of breast: Secondary | ICD-10-CM

## 2019-06-07 DEATH — deceased

## 2020-03-12 ENCOUNTER — Other Ambulatory Visit: Payer: Self-pay | Admitting: Internal Medicine

## 2020-03-12 ENCOUNTER — Ambulatory Visit
Admission: RE | Admit: 2020-03-12 | Discharge: 2020-03-12 | Disposition: A | Discharge status: Home / Self Care | Payer: Medicare HMO | Source: Ambulatory Visit | Attending: Internal Medicine | Admitting: Internal Medicine

## 2020-03-12 ENCOUNTER — Other Ambulatory Visit: Payer: Self-pay

## 2020-03-12 DIAGNOSIS — R921 Mammographic calcification found on diagnostic imaging of breast: Secondary | ICD-10-CM

## 2020-07-15 ENCOUNTER — Other Ambulatory Visit: Payer: Self-pay

## 2020-07-15 ENCOUNTER — Ambulatory Visit
Admission: RE | Admit: 2020-07-15 | Discharge: 2020-07-15 | Disposition: A | Discharge status: Home / Self Care | Payer: Medicare HMO | Source: Ambulatory Visit | Attending: Internal Medicine | Admitting: Internal Medicine

## 2020-07-15 DIAGNOSIS — R921 Mammographic calcification found on diagnostic imaging of breast: Secondary | ICD-10-CM

## 2020-09-14 IMAGING — MG DIGITAL SCREENING BILATERAL MAMMOGRAM WITH TOMO AND CAD
8 series · 8 of 24 positions shown · non-contrast
Comparison: Previous exam(s).

CLINICAL DATA: Screening.

EXAM:
DIGITAL SCREENING BILATERAL MAMMOGRAM WITH TOMO AND CAD

[R CC synth-2D]
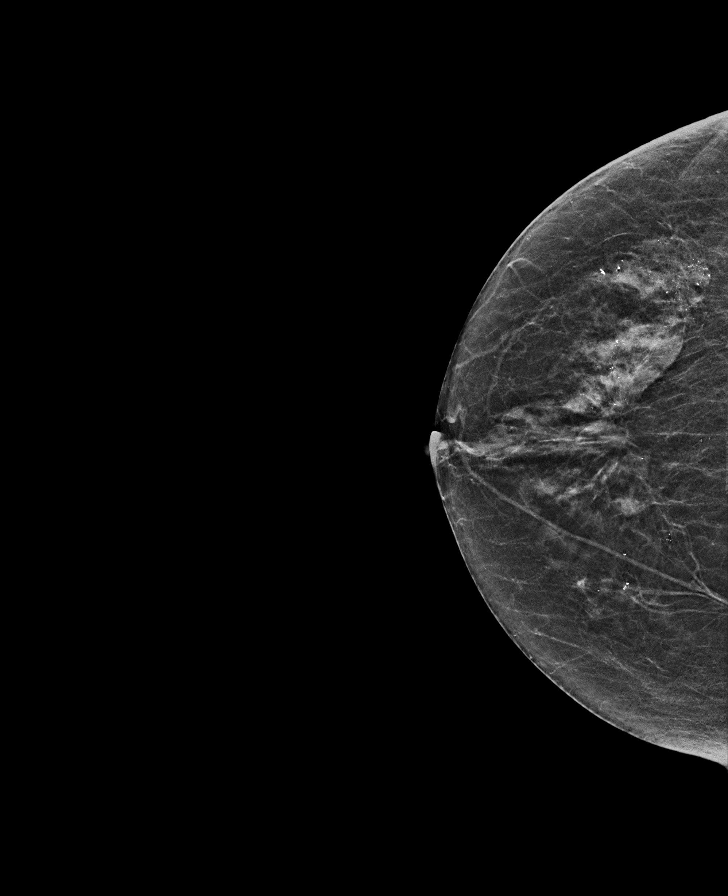

[L CC synth-2D]
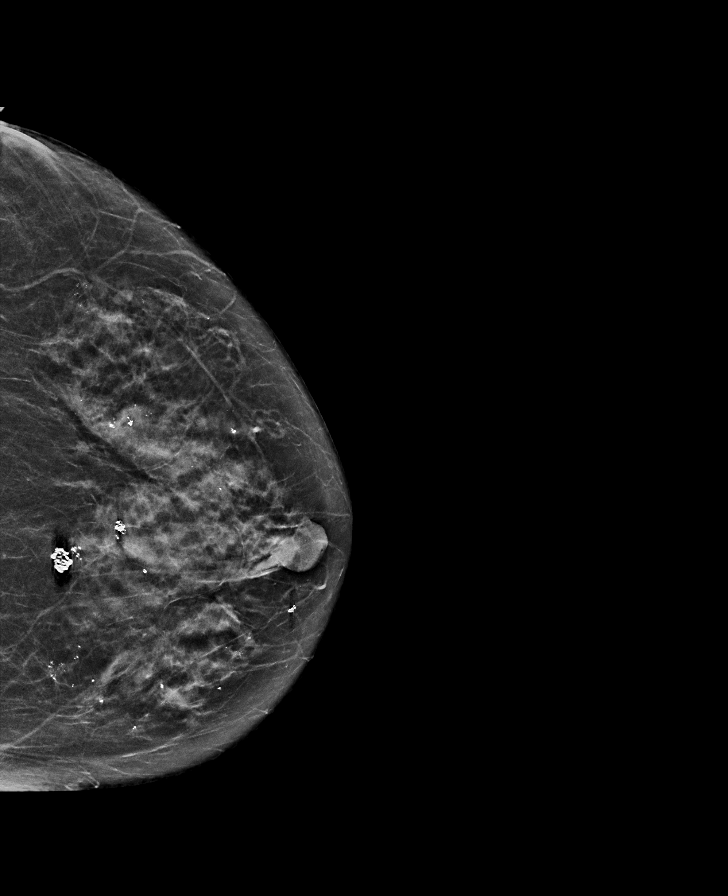

[R MLO synth-2D]
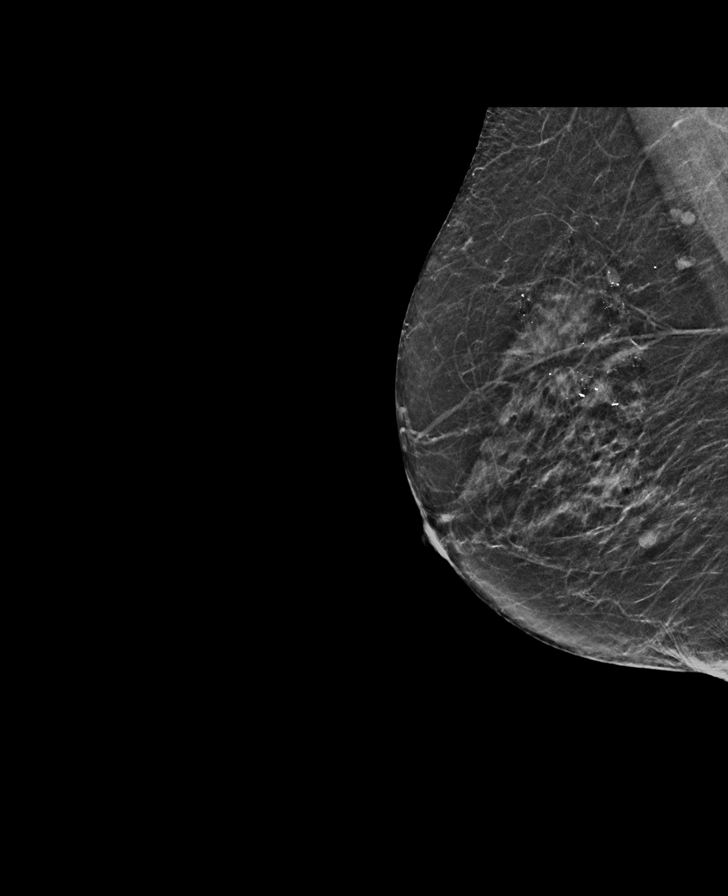

[L MLO synth-2D]
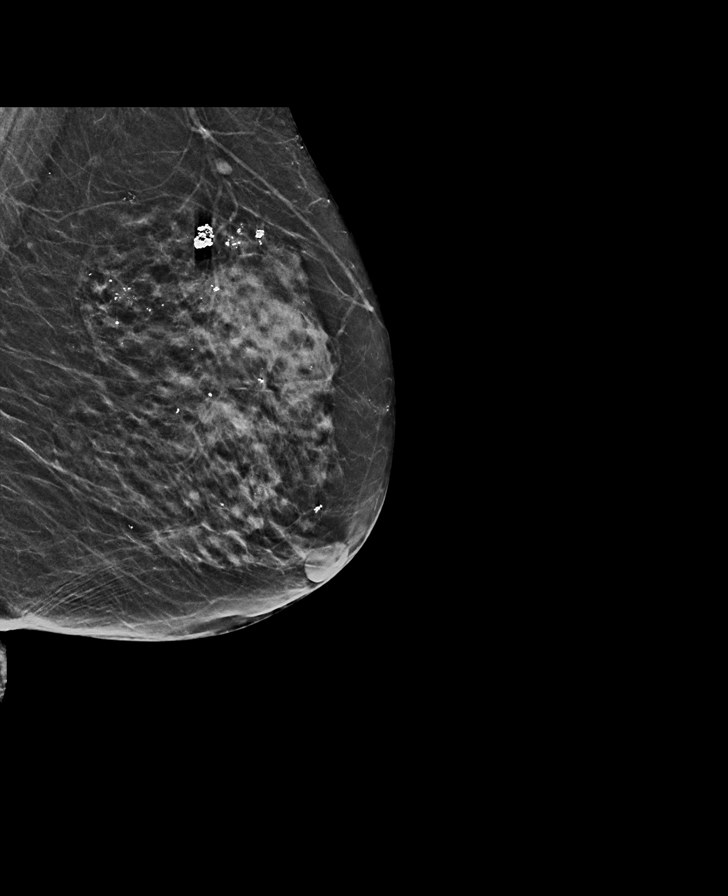

[L CC tomo · tomo slice 27/52.0]
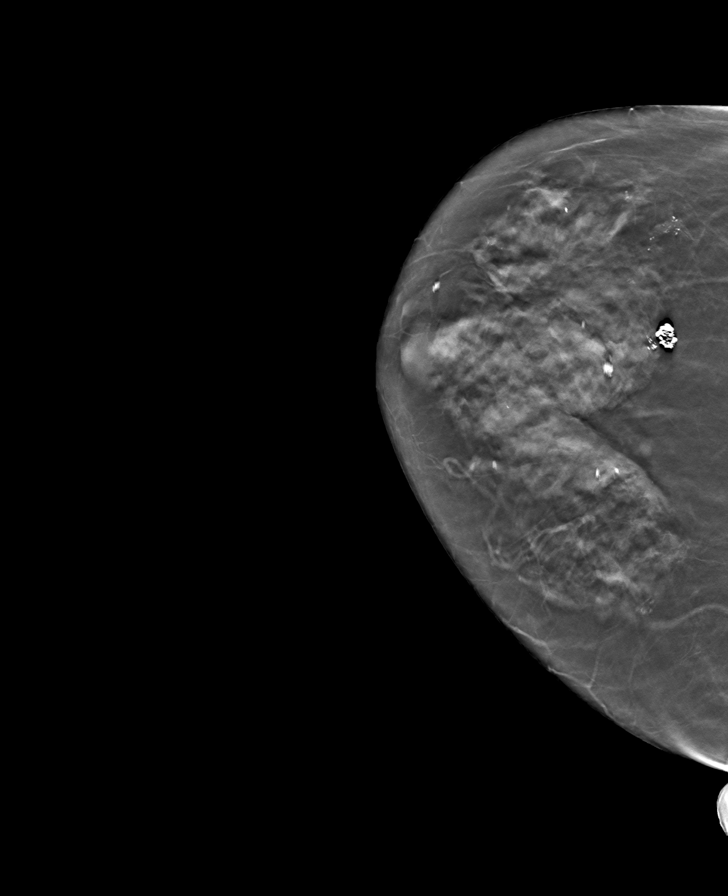

[R MLO tomo · tomo slice 25/48.0]
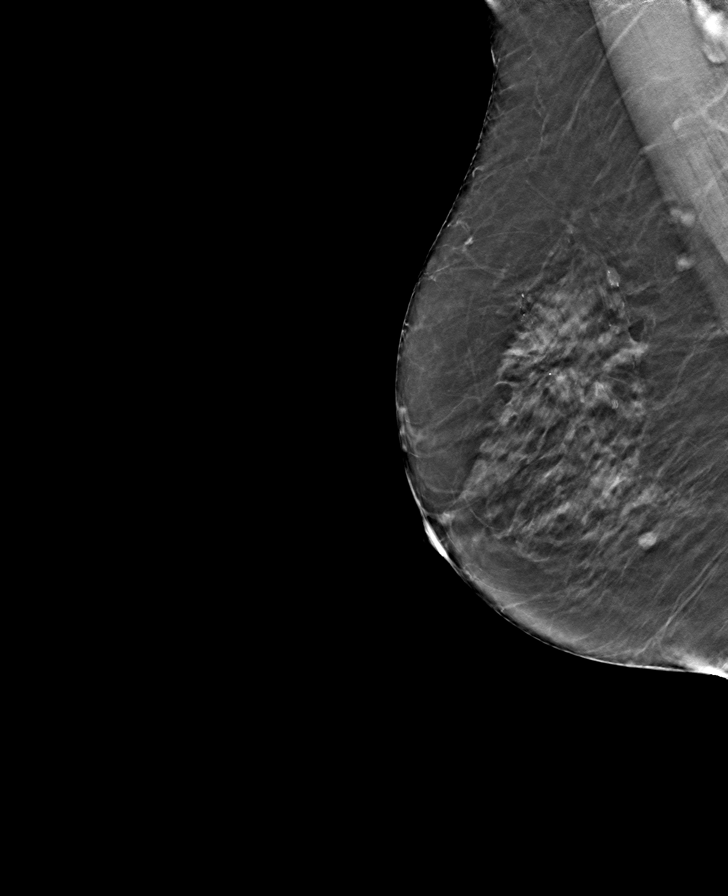

[L MLO tomo · tomo slice 25/48.0]
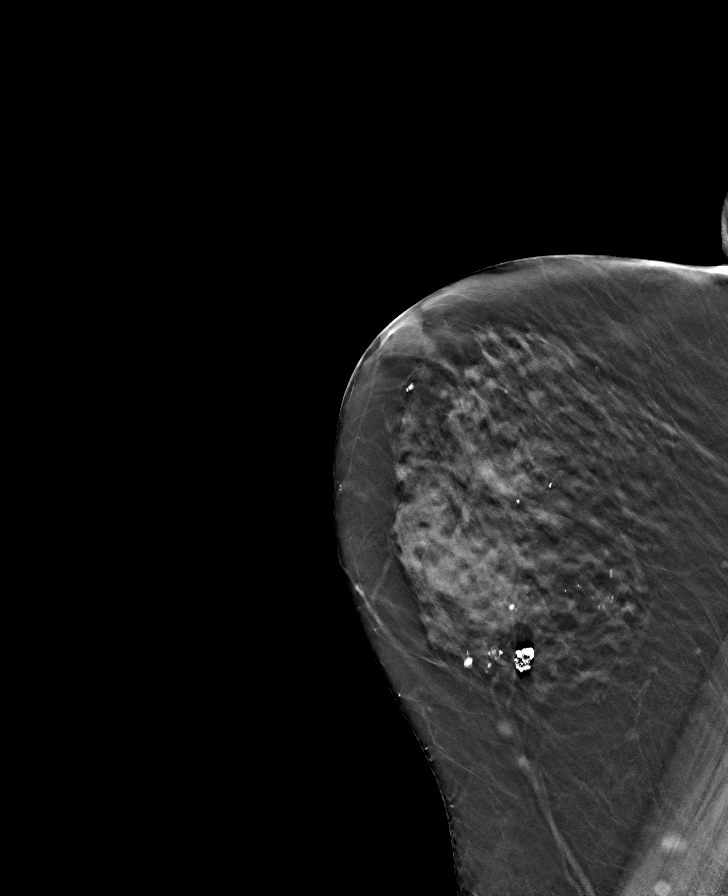

[R CC tomo · tomo slice 25/48.0]
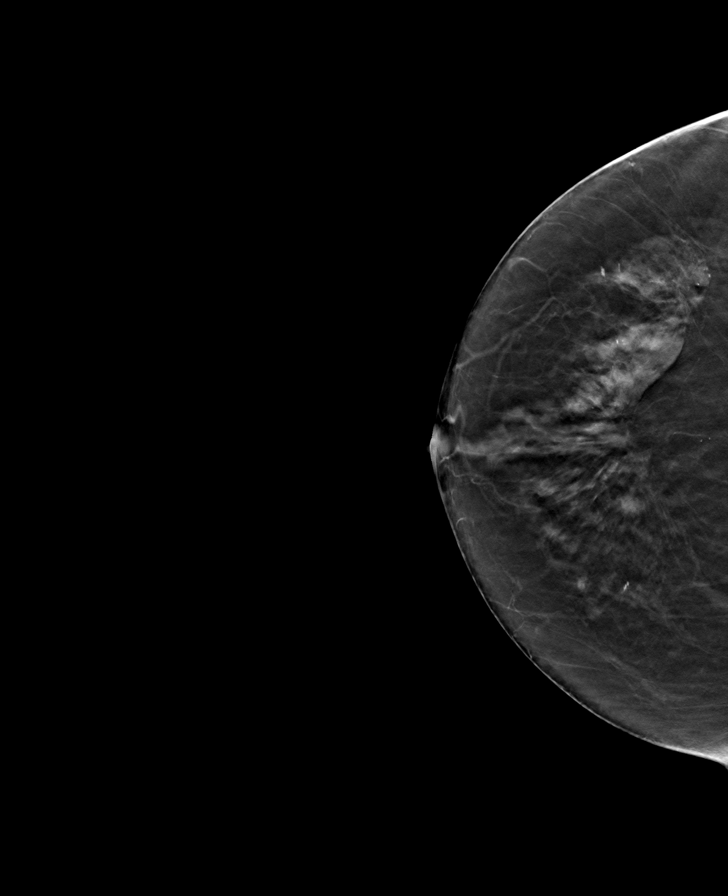

[8 of 24 positions shown; findings below may reference images not displayed]

ACR Breast Density Category b: There are scattered areas of
fibroglandular density.
FINDINGS: In the right breast, calcifications warrant further evaluation with
magnified views. In the left breast, no findings suspicious for
malignancy. Images were processed with CAD.
IMPRESSION: Further evaluation is suggested for calcifications in the right
breast.

RECOMMENDATION:
Diagnostic mammogram of the right breast. (Code:[id number]-[id number])

The patient will be contacted regarding the findings, and additional
imaging will be scheduled.

BI-RADS CATEGORY  0: Incomplete. Need additional imaging evaluation
and/or prior mammograms for comparison.

## 2023-08-07 DEATH — deceased
